# Patient Record
Sex: Male | Born: 1995 | Race: Black or African American | Hispanic: No | Marital: Single | Smoking: Never smoker
Health system: Southern US, Community
[De-identification: ages and names within clinical notes are randomized; demographics above are authoritative.]

---
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
## 2018-06-07 ENCOUNTER — Emergency Department (HOSPITAL_COMMUNITY): Payer: No Typology Code available for payment source

## 2018-06-07 ENCOUNTER — Emergency Department (HOSPITAL_COMMUNITY)
Admission: EM | Admit: 2018-06-07 | Discharge: 2018-06-07 | Disposition: A | Discharge status: Home / Self Care | Payer: No Typology Code available for payment source | Attending: Emergency Medicine | Admitting: Emergency Medicine

## 2018-06-07 ENCOUNTER — Encounter (HOSPITAL_COMMUNITY): Payer: Self-pay | Admitting: Emergency Medicine

## 2018-06-07 ENCOUNTER — Other Ambulatory Visit: Payer: Self-pay

## 2018-06-07 DIAGNOSIS — S8001XA Contusion of right knee, initial encounter: Secondary | ICD-10-CM | POA: Diagnosis not present

## 2018-06-07 DIAGNOSIS — W228XXA Striking against or struck by other objects, initial encounter: Secondary | ICD-10-CM | POA: Diagnosis not present

## 2018-06-07 DIAGNOSIS — Y92810 Car as the place of occurrence of the external cause: Secondary | ICD-10-CM | POA: Diagnosis not present

## 2018-06-07 DIAGNOSIS — Y999 Unspecified external cause status: Secondary | ICD-10-CM | POA: Diagnosis not present

## 2018-06-07 DIAGNOSIS — Y9389 Activity, other specified: Secondary | ICD-10-CM | POA: Insufficient documentation

## 2018-06-07 DIAGNOSIS — Z79899 Other long term (current) drug therapy: Secondary | ICD-10-CM | POA: Insufficient documentation

## 2018-06-07 DIAGNOSIS — S8991XA Unspecified injury of right lower leg, initial encounter: Secondary | ICD-10-CM | POA: Diagnosis present
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
## 2019-12-03 ENCOUNTER — Emergency Department (HOSPITAL_COMMUNITY)
Admission: EM | Admit: 2019-12-03 | Discharge: 2019-12-03 | Disposition: A | Discharge status: Home / Self Care | Payer: Medicaid Other | Attending: Emergency Medicine | Admitting: Emergency Medicine

## 2019-12-03 ENCOUNTER — Other Ambulatory Visit: Payer: Self-pay

## 2019-12-03 DIAGNOSIS — Z5321 Procedure and treatment not carried out due to patient leaving prior to being seen by health care provider: Secondary | ICD-10-CM | POA: Insufficient documentation

## 2019-12-03 DIAGNOSIS — M25579 Pain in unspecified ankle and joints of unspecified foot: Secondary | ICD-10-CM | POA: Insufficient documentation

## 2019-12-03 NOTE — ED Triage Notes (Signed)
Patient reports twisting ankle approx 2 weeks ago at work. States he has swelling, pain and some trouble bearing weight on foot.

## 2019-12-03 NOTE — ED Notes (Signed)
Pt eloped from ER prior to room call. Called 3X.

## 2022-07-16 ENCOUNTER — Other Ambulatory Visit (HOSPITAL_BASED_OUTPATIENT_CLINIC_OR_DEPARTMENT_OTHER): Payer: Self-pay

## 2022-07-16 ENCOUNTER — Emergency Department (HOSPITAL_BASED_OUTPATIENT_CLINIC_OR_DEPARTMENT_OTHER): Payer: 59 | Admitting: Radiology

## 2022-07-16 ENCOUNTER — Other Ambulatory Visit: Payer: Self-pay

## 2022-07-16 ENCOUNTER — Emergency Department (HOSPITAL_BASED_OUTPATIENT_CLINIC_OR_DEPARTMENT_OTHER)
Admission: EM | Admit: 2022-07-16 | Discharge: 2022-07-16 | Disposition: A | Discharge status: Home / Self Care | Payer: 59 | Attending: Emergency Medicine | Admitting: Emergency Medicine

## 2022-07-16 ENCOUNTER — Encounter (HOSPITAL_BASED_OUTPATIENT_CLINIC_OR_DEPARTMENT_OTHER): Payer: Self-pay | Admitting: Emergency Medicine

## 2022-07-16 DIAGNOSIS — J020 Streptococcal pharyngitis: Secondary | ICD-10-CM

## 2022-07-16 DIAGNOSIS — J029 Acute pharyngitis, unspecified: Secondary | ICD-10-CM | POA: Diagnosis present

## 2022-07-16 DIAGNOSIS — Z1152 Encounter for screening for COVID-19: Secondary | ICD-10-CM | POA: Insufficient documentation

## 2022-07-16 LAB — RESP PANEL BY RT-PCR (RSV, FLU A&B, COVID)  RVPGX2
Influenza A by PCR: NEGATIVE
Influenza B by PCR: NEGATIVE
Resp Syncytial Virus by PCR: NEGATIVE
SARS Coronavirus 2 by RT PCR: NEGATIVE

## 2022-07-16 LAB — GROUP A STREP BY PCR: Group A Strep by PCR: DETECTED — AB

## 2022-07-16 MED ORDER — AMOXICILLIN 500 MG PO CAPS
500.0000 mg | ORAL_CAPSULE | Freq: Once | ORAL | Status: AC
  Administered 2022-07-16: 500 mg via ORAL
  Filled 2022-07-16: qty 1

## 2022-07-16 MED ORDER — ONDANSETRON HCL 4 MG PO TABS
4.0000 mg | ORAL_TABLET | Freq: Four times a day (QID) | ORAL | 0 refills | Status: AC
  Filled 2022-07-16: qty 12, 3d supply, fill #0

## 2022-07-16 MED ORDER — FAMOTIDINE 20 MG PO TABS
20.0000 mg | ORAL_TABLET | Freq: Once | ORAL | Status: AC
  Administered 2022-07-16: 20 mg via ORAL
  Filled 2022-07-16: qty 1

## 2022-07-16 MED ORDER — AMOXICILLIN 500 MG PO CAPS
500.0000 mg | ORAL_CAPSULE | Freq: Two times a day (BID) | ORAL | 0 refills | Status: AC
  Filled 2022-07-16: qty 20, 10d supply, fill #0

## 2022-07-16 MED ORDER — ONDANSETRON 4 MG PO TBDP
4.0000 mg | ORAL_TABLET | Freq: Once | ORAL | Status: AC
  Administered 2022-07-16: 4 mg via ORAL
  Filled 2022-07-16: qty 1

## 2022-07-16 MED ORDER — LIDOCAINE VISCOUS HCL 2 % MT SOLN
15.0000 mL | Freq: Once | OROMUCOSAL | Status: AC
  Administered 2022-07-16: 15 mL via ORAL
  Filled 2022-07-16: qty 15

## 2022-07-16 MED ORDER — IBUPROFEN 800 MG PO TABS
800.0000 mg | ORAL_TABLET | Freq: Once | ORAL | Status: AC
  Administered 2022-07-16: 800 mg via ORAL
  Filled 2022-07-16: qty 1

## 2022-07-16 MED ORDER — ALUM & MAG HYDROXIDE-SIMETH 200-200-20 MG/5ML PO SUSP
30.0000 mL | Freq: Once | ORAL | Status: AC
  Administered 2022-07-16: 30 mL via ORAL
  Filled 2022-07-16: qty 30

## 2022-07-16 NOTE — ED Provider Notes (Signed)
[facility] Provider Note   CSN: [id number] Arrival date & time: 07/16/22  [id number]     History  Chief Complaint  Patient presents with   Sore Throat    Wayne Cross is a 27 y.o. male.  Patient is a 27 year old male with no significant past medical history presenting to the emergency department with throat pain.  The patient states that his pain started about a week ago after eating a sandwich with [employer].  He states that he initially felt the pain on the left side that lasted for about 3 days.  He states that the pain resolved and then returned 2 days ago.  He states he has associated nausea and vomiting and has been unable to eat or drink anything in the last 2 days.  He denies any fevers or chills.  He states he has had a mild cough.  He denies any chest or abdominal pain.  He denies any prior history of food bolus obstruction or esophagitis.  The history is provided by the patient.  Sore Throat       Home Medications Prior to Admission medications   Medication Sig Start Date End Date Taking? Authorizing Provider  amoxicillin (AMOXIL) 500 MG capsule Take 1 capsule (500 mg total) by mouth 2 (two) times daily for 10 days. 07/16/22 07/26/22 Yes Kingsley, Jordan Hawks K, DO  ondansetron (ZOFRAN) 4 MG tablet Take 1 tablet (4 mg total) by mouth every 6 (six) hours. 07/16/22  Yes Maylon Peppers, Jordan Hawks K, DO  naproxen (NAPROSYN) 375 MG tablet Take 1 tablet (375 mg total) by mouth 2 (two) times daily. 04/21/12   Kirichenko, Tatyana, PA-C  traMADol (ULTRAM) 50 MG tablet Take 1 tablet (50 mg total) by mouth every 6 (six) hours as needed for pain. 04/21/12   Jeannett Senior, PA-C      Allergies    Patient has no known allergies.    Review of Systems   Review of Systems  Physical Exam Updated Vital Signs BP (!) 149/98 (BP Location: Right Arm)   Pulse (!) 47   Temp 98.4 F (36.9 C)   Resp 16   SpO2 99%  Physical Exam Vitals and nursing note  reviewed.  Constitutional:      General: He is not in acute distress.    Appearance: He is well-developed.  HENT:     Head: Normocephalic and atraumatic.     Mouth/Throat:     Mouth: Mucous membranes are moist.     Pharynx: Uvula midline. Posterior oropharyngeal erythema present.     Tonsils: Tonsillar exudate present. No tonsillar abscesses. 1+ on the right. 2+ on the left.     Comments: No trismus Tolerating secretions Normal phonation Eyes:     Conjunctiva/sclera: Conjunctivae normal.  Cardiovascular:     Rate and Rhythm: Normal rate and regular rhythm.     Heart sounds: Normal heart sounds.  Pulmonary:     Effort: Pulmonary effort is normal.     Breath sounds: Normal breath sounds. No stridor.  Abdominal:     General: Bowel sounds are normal.     Palpations: Abdomen is soft.  Musculoskeletal:     Cervical back: Normal range of motion and neck supple.  Lymphadenopathy:     Cervical: Cervical adenopathy (R-sided) present.  Skin:    General: Skin is warm and dry.  Neurological:     General: No focal deficit present.     Mental Status: He is alert and oriented to  person, place, and time.  Psychiatric:        Mood and Affect: Mood normal.        Behavior: Behavior normal.     ED Results / Procedures / Treatments   Labs (all labs ordered are listed, but only abnormal results are displayed) Labs Reviewed  GROUP A STREP BY PCR - Abnormal; Notable for the following components:      Result Value   Group A Strep by PCR DETECTED (*)    All other components within normal limits  RESP PANEL BY RT-PCR (RSV, FLU A&B, COVID)  [id number]    EKG None  Radiology DG Neck Soft Tissue  Result Date: 07/16/2022 CLINICAL DATA:  foreign body sensation EXAM: NECK SOFT TISSUES - 1+ VIEW COMPARISON:  None Available. FINDINGS: No prevertebral/retropharyngeal soft tissue swelling. The cervical airway is unremarkable. No evidence of radiopaque foreign body. No acute osseous abnormality.  IMPRESSION: No evidence of radiopaque foreign body. Electronically Signed   By: Maurine Simmering M.D.   On: 07/16/2022 11:09    Procedures Procedures    Medications Ordered in ED Medications  ondansetron (ZOFRAN-ODT) disintegrating tablet 4 mg (4 mg Oral Given 07/16/22 0946)  famotidine (PEPCID) tablet 20 mg (20 mg Oral Given 07/16/22 0945)  alum & mag hydroxide-simeth (MAALOX/MYLANTA) 200-200-20 MG/5ML suspension 30 mL (30 mLs Oral Given 07/16/22 0946)    And  lidocaine (XYLOCAINE) 2 % viscous mouth solution 15 mL (15 mLs Oral Given 07/16/22 0946)  ibuprofen (ADVIL) tablet 800 mg (800 mg Oral Given 07/16/22 0945)  amoxicillin (AMOXIL) capsule 500 mg (500 mg Oral Given 07/16/22 1023)    ED Course/ Medical Decision Making/ A&P Clinical Course as of 07/16/22 1121  Fri Jul 16, 2022  0955 Strep positive, he will be given amoxicillin.  [VK]  1103 Patient was able to tolerate PO without vomiting here. No obvious foreign body on neck XR. Pain likely due to strep and he is stable for discharge with primary care follow up. [VK]    Clinical Course User Index [VK] Kemper Durie, DO                             Medical Decision Making This patient presents to the ED with chief complaint(s) of throat pain with no pertinent past medical history which further complicates the presenting complaint. The complaint involves an extensive differential diagnosis and also carries with it a high risk of complications and morbidity.    The differential diagnosis includes strep throat, viral pharyngitis, esophagitis, food bolus, no stridor or SOB making aspiration unlikely, no signs of severe dehydration on exam   Additional history obtained: Additional history obtained from N/A Records reviewed Primary Care Documents  ED Course and Reassessment: Patient was initially evaluated by triage and had strep and COVID swab performed.  He does have tonsillar swelling and exudates concerning for pharyngitis however with his  symptoms starting after eating and unable to tolerate p.o. since then also considering an esophagitis or food bolus.  He will be treated with a GI cocktail and pain control as well as given nausea medication and will be assessed for ability to tolerate p.o.  Neck x-ray will be performed to evaluate for foreign body and he will be closely reassessed.  Independent labs interpretation:  The following labs were independently interpreted: Strep positive  Independent visualization of imaging: - I independently visualized the following imaging with scope of interpretation limited to determining acute  life threatening conditions related to emergency care: Neck XR, which revealed no acute disease  Consultation: - Consulted or discussed management/test interpretation w/ external professional: N/A  Consideration for admission or further workup: Patient has no emergent conditions requiring admission or further work-up at this time and is stable for discharge home with primary care follow-up  Social Determinants of health: N/A    Amount and/or Complexity of Data Reviewed Radiology: ordered.  Risk OTC drugs. Prescription drug management.          Final Clinical Impression(s) / ED Diagnoses Final diagnoses:  Strep throat    Rx / DC Orders ED Discharge Orders          Ordered    amoxicillin (AMOXIL) 500 MG capsule  2 times daily        07/16/22 1119    ondansetron (ZOFRAN) 4 MG tablet  Every 6 hours        07/16/22 1119              Leanord Asal K, DO 07/16/22 1121

## 2022-07-16 NOTE — Discharge Instructions (Signed)
You were seen in the emergency department for your throat pain.  You tested positive for strep and we treated you with antibiotics.  You should complete these as prescribed.  I have given you some nausea medication that you can take as needed for nausea and you can take Motrin as needed for pain.  You should follow-up with your primary doctor in the next few days to have your symptoms rechecked.  You should return to the emergency department if you are having fevers despite the antibiotics, repetitive vomiting and unable to keep the antibiotics down, significantly worsening pain, unable to swallow your saliva or open your mouth or if you have any other new or concerning symptoms.

## 2022-07-16 NOTE — ED Triage Notes (Signed)
Pt reports sore throat x 1 week. Denies fevers.

## 2022-07-30 ENCOUNTER — Ambulatory Visit: Payer: 59 | Admitting: Internal Medicine

## 2022-11-11 ENCOUNTER — Other Ambulatory Visit (HOSPITAL_COMMUNITY): Payer: Self-pay

## 2022-11-13 ENCOUNTER — Other Ambulatory Visit (HOSPITAL_BASED_OUTPATIENT_CLINIC_OR_DEPARTMENT_OTHER): Payer: Self-pay
